# Patient Record
Sex: Male | Born: 1980 | Race: Black or African American | Hispanic: No | Marital: Single | Smoking: Current every day smoker
Health system: Southern US, Community
[De-identification: ages and names within clinical notes are randomized; demographics above are authoritative.]

## PROBLEM LIST (undated history)

## (undated) DIAGNOSIS — Z720 Tobacco use: Secondary | ICD-10-CM

## (undated) DIAGNOSIS — J45909 Unspecified asthma, uncomplicated: Secondary | ICD-10-CM

## (undated) HISTORY — PX: SPLENECTOMY: SUR1306

## (undated) HISTORY — PX: SHOULDER SURGERY: SHX246

## (undated) HISTORY — PX: KNEE SURGERY: SHX244

---

## 2016-03-12 ENCOUNTER — Encounter (HOSPITAL_BASED_OUTPATIENT_CLINIC_OR_DEPARTMENT_OTHER): Payer: Self-pay | Admitting: *Deleted

## 2016-03-12 ENCOUNTER — Emergency Department (HOSPITAL_BASED_OUTPATIENT_CLINIC_OR_DEPARTMENT_OTHER): Payer: PRIVATE HEALTH INSURANCE

## 2016-03-12 ENCOUNTER — Inpatient Hospital Stay (HOSPITAL_BASED_OUTPATIENT_CLINIC_OR_DEPARTMENT_OTHER)
Admission: EM | Admit: 2016-03-12 | Discharge: 2016-03-14 | DRG: 202 | Disposition: A | Discharge status: Home / Self Care | Payer: PRIVATE HEALTH INSURANCE | Attending: Internal Medicine | Admitting: Internal Medicine

## 2016-03-12 DIAGNOSIS — J9601 Acute respiratory failure with hypoxia: Secondary | ICD-10-CM | POA: Diagnosis present

## 2016-03-12 DIAGNOSIS — J9600 Acute respiratory failure, unspecified whether with hypoxia or hypercapnia: Secondary | ICD-10-CM | POA: Diagnosis present

## 2016-03-12 DIAGNOSIS — F17210 Nicotine dependence, cigarettes, uncomplicated: Secondary | ICD-10-CM | POA: Diagnosis present

## 2016-03-12 DIAGNOSIS — J45901 Unspecified asthma with (acute) exacerbation: Principal | ICD-10-CM | POA: Diagnosis present

## 2016-03-12 DIAGNOSIS — Z720 Tobacco use: Secondary | ICD-10-CM | POA: Diagnosis present

## 2016-03-12 DIAGNOSIS — Z9081 Acquired absence of spleen: Secondary | ICD-10-CM

## 2016-03-12 DIAGNOSIS — R509 Fever, unspecified: Secondary | ICD-10-CM | POA: Diagnosis not present

## 2016-03-12 DIAGNOSIS — J189 Pneumonia, unspecified organism: Secondary | ICD-10-CM | POA: Diagnosis present

## 2016-03-12 DIAGNOSIS — E876 Hypokalemia: Secondary | ICD-10-CM | POA: Diagnosis present

## 2016-03-12 DIAGNOSIS — R030 Elevated blood-pressure reading, without diagnosis of hypertension: Secondary | ICD-10-CM | POA: Diagnosis present

## 2016-03-12 HISTORY — DX: Tobacco use: Z72.0

## 2016-03-12 HISTORY — DX: Unspecified asthma, uncomplicated: J45.909

## 2016-03-12 LAB — CBC WITH DIFFERENTIAL/PLATELET
BASOS PCT: 1 %
Basophils Absolute: 0 10*3/uL (ref 0.0–0.1)
Eosinophils Absolute: 0.6 10*3/uL (ref 0.0–0.7)
Eosinophils Relative: 7 %
HEMATOCRIT: 38 % — AB (ref 39.0–52.0)
HEMOGLOBIN: 13.1 g/dL (ref 13.0–17.0)
LYMPHS ABS: 3 10*3/uL (ref 0.7–4.0)
Lymphocytes Relative: 36 %
MCH: 30.8 pg (ref 26.0–34.0)
MCHC: 34.5 g/dL (ref 30.0–36.0)
MCV: 89.4 fL (ref 78.0–100.0)
MONO ABS: 0.9 10*3/uL (ref 0.1–1.0)
Monocytes Relative: 12 %
NEUTROS ABS: 3.6 10*3/uL (ref 1.7–7.7)
NEUTROS PCT: 44 %
Platelets: 293 10*3/uL (ref 150–400)
RBC: 4.25 MIL/uL (ref 4.22–5.81)
RDW: 13.7 % (ref 11.5–15.5)
WBC: 8.1 10*3/uL (ref 4.0–10.5)

## 2016-03-12 LAB — BASIC METABOLIC PANEL
Anion gap: 9 (ref 5–15)
BUN: 13 mg/dL (ref 6–20)
CALCIUM: 9.1 mg/dL (ref 8.9–10.3)
CHLORIDE: 108 mmol/L (ref 101–111)
CO2: 23 mmol/L (ref 22–32)
CREATININE: 0.89 mg/dL (ref 0.61–1.24)
GFR calc Af Amer: >60 mL/min (ref >60)
GFR calc non Af Amer: >60 mL/min (ref >60)
GLUCOSE: 98 mg/dL (ref 65–99)
Potassium: 3.3 mmol/L — ABNORMAL LOW (ref 3.5–5.1)
Sodium: 140 mmol/L (ref 135–145)

## 2016-03-12 MED ORDER — POTASSIUM CHLORIDE IN NACL 20-0.9 MEQ/L-% IV SOLN
Freq: Once | INTRAVENOUS | Status: AC
  Administered 2016-03-12: 21:00:00 via INTRAVENOUS
  Filled 2016-03-12: qty 1000

## 2016-03-12 MED ORDER — ALBUTEROL (5 MG/ML) CONTINUOUS INHALATION SOLN
10.0000 mg/h | INHALATION_SOLUTION | RESPIRATORY_TRACT | Status: AC
  Administered 2016-03-12: 10 mg/h via RESPIRATORY_TRACT
  Filled 2016-03-12: qty 20

## 2016-03-12 MED ORDER — MAGNESIUM SULFATE 2 GM/50ML IV SOLN
2.0000 g | Freq: Once | INTRAVENOUS | Status: AC
  Administered 2016-03-12: 2 g via INTRAVENOUS
  Filled 2016-03-12: qty 50

## 2016-03-12 MED ORDER — IPRATROPIUM-ALBUTEROL 0.5-2.5 (3) MG/3ML IN SOLN
3.0000 mL | Freq: Once | RESPIRATORY_TRACT | Status: AC
  Administered 2016-03-12: 3 mL via RESPIRATORY_TRACT
  Filled 2016-03-12: qty 3

## 2016-03-12 MED ORDER — POTASSIUM CHLORIDE CRYS ER 20 MEQ PO TBCR
40.0000 meq | EXTENDED_RELEASE_TABLET | Freq: Once | ORAL | Status: AC
  Administered 2016-03-12: 40 meq via ORAL
  Filled 2016-03-12: qty 2

## 2016-03-12 MED ORDER — ALBUTEROL SULFATE (2.5 MG/3ML) 0.083% IN NEBU
2.5000 mg | INHALATION_SOLUTION | Freq: Once | RESPIRATORY_TRACT | Status: AC
  Administered 2016-03-12: 2.5 mg via RESPIRATORY_TRACT
  Filled 2016-03-12: qty 3

## 2016-03-12 MED ORDER — DEXTROSE 5 % IV SOLN
500.0000 mg | Freq: Once | INTRAVENOUS | Status: AC
Start: 2016-03-12 — End: 2016-03-12
  Administered 2016-03-12: 500 mg via INTRAVENOUS

## 2016-03-12 MED ORDER — AZITHROMYCIN 500 MG IV SOLR
INTRAVENOUS | Status: AC
  Filled 2016-03-12: qty 500

## 2016-03-12 MED ORDER — PREDNISONE 50 MG PO TABS
50.0000 mg | ORAL_TABLET | Freq: Once | ORAL | Status: AC
  Administered 2016-03-12: 50 mg via ORAL
  Filled 2016-03-12: qty 1

## 2016-03-12 MED ORDER — IPRATROPIUM-ALBUTEROL 0.5-2.5 (3) MG/3ML IN SOLN
3.0000 mL | RESPIRATORY_TRACT | Status: DC
  Filled 2016-03-12: qty 3

## 2016-03-12 MED ORDER — DEXTROSE 5 % IV SOLN
1.0000 g | Freq: Once | INTRAVENOUS | Status: AC
  Administered 2016-03-12: 1 g via INTRAVENOUS
  Filled 2016-03-12: qty 10

## 2016-03-12 NOTE — Progress Notes (Signed)
Patient ID: Edgar Brown, male   DOB: 1981/08/30, 35 y.o.   MRN: [id number]  Accepted to a tele[facility] bed at [facility]. Please call the floor manager at extension [phone], when the patient arrives, for admitting physician assignment.  Per Crista Curbana Liu, M.D.   Chief Complaint  Patient presents with  . Shortness of Breath     (Consider location/radiation/quality/duration/timing/severity/associated sxs/prior Treatment) HPI  35 year old male with history of asthma who presents with shortness of breath. States that he has had sneezing, nasal congestion, mild nonproductive cough over the past few days. Has had increasing shortness of breath, acutely worsened today. He has been using his rescue inhaler multiple times today without significant good effect. With chest discomfort associating, subjective fevers and chills. No lower extremity edema or pain. Last hospitalization and steroid use in 2013.           Component Value Units    Basic metabolic panel [[id number]][[id number]] (Abnormal) Collected: 03/12/16 1930   Updated: 03/12/16 2000    Specimen Type: Blood    Specimen Source: Vein     Sodium 140 mmol/L    Potassium 3.3 (L) mmol/L    Chloride 108 mmol/L    CO2 23 mmol/L    Glucose, Bld 98 mg/dL    BUN 13 mg/dL    Creatinine, Ser 1.320.89 mg/dL    Calcium 9.1 mg/dL    GFR calc non Af Amer >60 mL/min    GFR calc Af Amer >60 mL/min    Anion gap 9   CBC with Differential [[id number]][[id number]] (Abnormal) Collected: 03/12/16 1930   Updated: 03/12/16 1939    Specimen Type: Blood    Specimen Source: Vein     WBC 8.1 K/uL    RBC 4.25 MIL/uL    Hemoglobin 13.1 g/dL    HCT 36.638.0 (L) %    MCV 89.4 fL    MCH 30.8 pg    MCHC 34.5 g/dL    RDW 44.013.7 %    Platelets 293 K/uL    Neutrophils Relative % 44 %    Neutro Abs 3.6 K/uL    Lymphocytes Relative 36 %    Lymphs Abs 3.0 K/uL    Monocytes Relative 12 %    Monocytes Absolute 0.9 K/uL    Eosinophils Relative 7 %     Eosinophils Absolute 0.6 K/uL    Basophils Relative 1 %    Basophils Absolute 0.0 K/uL     CLINICAL DATA: Breathing treatment and labs Asthma attack and near syncope x 5 days ago. Pt complains of Lt side chest and arm pain over past few days. HX: Asthma, smoker.  EXAM: CHEST 2 VIEW  COMPARISON: None.  FINDINGS: Small area of interstitial and hazy airspace opacity in the right upper lobe. This could reflect atelectasis or infection. Atelectasis favored.  Lungs otherwise clear.  No pleural effusion or pneumothorax.  Normal heart, mediastinum and hila.  Skeletal structures are unremarkable.  IMPRESSION: 1. Small area of opacity in the right upper lobe. This is most likely atelectasis. Pneumonia is possible. 2. No other abnormality.  Sanda Kleinavid Raela Bohl, M.D.

## 2016-03-12 NOTE — ED Provider Notes (Addendum)
CSN: [id number]     Arrival date & time 03/12/16  1856 History   First MD Initiated Contact with Patient 03/12/16 1907     Chief Complaint  Patient presents with  . Shortness of Breath     (Consider location/radiation/quality/duration/timing/severity/associated sxs/prior Treatment) HPI  35 year old male with history of asthma who presents with shortness of breath. States that he has had sneezing, nasal congestion, mild nonproductive cough over the past few days. Has had increasing shortness of breath, acutely worsened today. He has been using his rescue inhaler multiple times today without significant good effect. With chest discomfort associating, subjective fevers and chills. No lower extremity edema or pain. Last hospitalization and steroid use in 2013.  Past Medical History  Diagnosis Date  . Asthma    Past Surgical History  Procedure Laterality Date  . Shoulder surgery    . Knee surgery    . Splenectomy     History reviewed. No pertinent family history. Social History  Substance Use Topics  . Smoking status: Current Every Day Smoker    Types: Cigarettes  . Smokeless tobacco: None  . Alcohol Use: No    Review of Systems 10/14 systems reviewed and are negative other than those stated in the HPI    Allergies  Review of patient's allergies indicates no known allergies.  Home Medications   Prior to Admission medications   Medication Sig Start Date End Date Taking? Authorizing Provider  ALBUTEROL IN Inhale into the lungs.   Yes Historical Provider, MD   BP 148/96 mmHg  Pulse 88  Temp(Src) 98.4 F (36.9 C) (Oral)  Resp 16  Ht 6\' 2"  (1.88 m)  Wt 220 lb (99.791 kg)  BMI 28.23 kg/m2  SpO2 97% Physical Exam Physical Exam  Nursing note and vitals reviewed. Constitutional: Well developed, well nourished, moderate respiratory distress Head: Normocephalic and atraumatic.  Mouth/Throat: Oropharynx is clear and moist.  Neck: Normal range of motion. Neck supple.   Cardiovascular: Normal rate and regular rhythm.  No LE edema. Pulmonary/Chest: Tachypnea with accessory muscle usage. Speaks in 4-5 word sentences. Diffuse inspiratory and expiratory wheezing on lung exam. Abdominal: Soft. There is no tenderness. There is no rebound and no guarding.  Musculoskeletal: Normal range of motion.  Neurological: Alert, no facial droop, fluent speech, moves all extremities symmetrically Skin: Skin is warm and dry.  Psychiatric: Cooperative  ED Course  Procedures (including critical care time) Labs Review Labs Reviewed  CBC WITH DIFFERENTIAL/PLATELET - Abnormal; Notable for the following:    HCT 38.0 (*)    All other components within normal limits  BASIC METABOLIC PANEL - Abnormal; Notable for the following:    Potassium 3.3 (*)    All other components within normal limits    Imaging Review Dg Chest 2 View  03/12/2016  CLINICAL DATA:  Breathing treatment and labs Asthma attack and near syncope x 5 days ago. Pt complains of Lt side chest and arm pain over past few days. HX: Asthma, smoker. EXAM: CHEST  2 VIEW COMPARISON:  None. FINDINGS: Small area of interstitial and hazy airspace opacity in the right upper lobe. This could reflect atelectasis or infection. Atelectasis favored. Lungs otherwise clear. No pleural effusion or pneumothorax. Normal heart, mediastinum and hila. Skeletal structures are unremarkable. IMPRESSION: 1. Small area of opacity in the right upper lobe. This is most likely atelectasis. Pneumonia is possible. 2. No other abnormality. Electronically Signed   By: Amie Portlandavid  Ormond M.D.   On: 03/12/2016 20:03   I have  personally reviewed and evaluated these images and lab results as part of my medical decision-making.   EKG Interpretation None      EKG: 03/12/2016 19:21:54 Normal sinus rhythm Borderline interventricular delay with LAD No acute ischemic changes    CRITICAL CARE Performed by: Lavera Guise   Total critical care time: 35  minutes  Critical care time was exclusive of separately billable procedures and treating other patients.  Critical care was necessary to treat or prevent imminent or life-threatening deterioration.  Critical care was time spent personally by me on the following activities: development of treatment plan with patient and/or surrogate as well as nursing, discussions with consultants, evaluation of patient's response to treatment, examination of patient, obtaining history from patient or surrogate, ordering and performing treatments and interventions, ordering and review of laboratory studies, ordering and review of radiographic studies, pulse oximetry and re-evaluation of patient's condition.   MDM   Final diagnoses:  Asthma exacerbation    35 year old male who presents with shortness of breath. Presentation consistent with that of an acute asthma exacerbation. He has tachypnea with accessory muscle usage and some mild conversational dyspnea on presentation. With diffuse inspiratory and expiratory wheezing on exam. With normal oxygenation although increased work of breathing. He is afebrile, normotensive and not tachycardic. Given a duoneb and started on continuous albuterol. Given 2 g of magnesium sulfate as well as 60 mg of prednisone. Chest x-ray suggestive of possible right upper lobe infiltrate and given that he has had some pneumonia like symptoms treated with ceftriaxone and azithromycin. Required additional hour of continuous albuterol after 1st hour given consistent wheezing and poor air movement, but patient visibly comfortable now with normal work of breathing and no conversational dyspnea. Started on IVF with potassium. Discussed with Dr. Robb Matar and will admit for observation at [facility] for continued treatment.  11:22PM Evaluated patient after 2nd hour of continuous neb. He is comfortably resting, speaks in full sentences, with normal oxygenation and normal work of breathing. With some  prolonged expiratory phase and some wheezing noted but significantly improved. Do not anticipate that he will require continuous albuterol. We'll transition to scheduled breathing treatments.   Lavera Guise, MD 03/12/16 4098  Lavera Guise, MD 03/12/16 [phone]

## 2016-03-12 NOTE — ED Notes (Signed)
Sob today. He started out with cold symptoms yesterday. He has been using his inhaler more than he normally would.

## 2016-03-13 ENCOUNTER — Encounter (HOSPITAL_COMMUNITY): Payer: Self-pay | Admitting: Internal Medicine

## 2016-03-13 DIAGNOSIS — Z720 Tobacco use: Secondary | ICD-10-CM | POA: Diagnosis present

## 2016-03-13 DIAGNOSIS — E876 Hypokalemia: Secondary | ICD-10-CM | POA: Diagnosis present

## 2016-03-13 DIAGNOSIS — Z9081 Acquired absence of spleen: Secondary | ICD-10-CM | POA: Diagnosis not present

## 2016-03-13 DIAGNOSIS — R030 Elevated blood-pressure reading, without diagnosis of hypertension: Secondary | ICD-10-CM | POA: Diagnosis present

## 2016-03-13 DIAGNOSIS — J45901 Unspecified asthma with (acute) exacerbation: Secondary | ICD-10-CM | POA: Diagnosis present

## 2016-03-13 DIAGNOSIS — J9600 Acute respiratory failure, unspecified whether with hypoxia or hypercapnia: Secondary | ICD-10-CM | POA: Diagnosis present

## 2016-03-13 DIAGNOSIS — F1910 Other psychoactive substance abuse, uncomplicated: Secondary | ICD-10-CM

## 2016-03-13 DIAGNOSIS — F17210 Nicotine dependence, cigarettes, uncomplicated: Secondary | ICD-10-CM | POA: Diagnosis present

## 2016-03-13 DIAGNOSIS — J189 Pneumonia, unspecified organism: Secondary | ICD-10-CM | POA: Diagnosis present

## 2016-03-13 DIAGNOSIS — J9601 Acute respiratory failure with hypoxia: Secondary | ICD-10-CM | POA: Diagnosis present

## 2016-03-13 DIAGNOSIS — R509 Fever, unspecified: Secondary | ICD-10-CM | POA: Diagnosis present

## 2016-03-13 LAB — RESPIRATORY PANEL BY PCR
Adenovirus: NOT DETECTED
BORDETELLA PERTUSSIS-RVPCR: NOT DETECTED
CORONAVIRUS HKU1-RVPPCR: NOT DETECTED
Chlamydophila pneumoniae: NOT DETECTED
Coronavirus 229E: NOT DETECTED
Coronavirus NL63: NOT DETECTED
Coronavirus OC43: NOT DETECTED
INFLUENZA A H1 2009-RVPPR: NOT DETECTED
Influenza A H1: NOT DETECTED
Influenza A H3: NOT DETECTED
Influenza A: NOT DETECTED
Influenza B: NOT DETECTED
METAPNEUMOVIRUS-RVPPCR: NOT DETECTED
Mycoplasma pneumoniae: NOT DETECTED
PARAINFLUENZA VIRUS 1-RVPPCR: NOT DETECTED
PARAINFLUENZA VIRUS 2-RVPPCR: NOT DETECTED
Parainfluenza Virus 3: NOT DETECTED
Parainfluenza Virus 4: NOT DETECTED
RHINOVIRUS / ENTEROVIRUS - RVPPCR: NOT DETECTED
Respiratory Syncytial Virus: NOT DETECTED

## 2016-03-13 LAB — INFLUENZA PANEL BY PCR (TYPE A & B)
H1N1 flu by pcr: NOT DETECTED
INFLAPCR: NEGATIVE
Influenza B By PCR: NEGATIVE

## 2016-03-13 LAB — STREP PNEUMONIAE URINARY ANTIGEN: STREP PNEUMO URINARY ANTIGEN: NEGATIVE

## 2016-03-13 LAB — HIV ANTIBODY (ROUTINE TESTING W REFLEX): HIV SCREEN 4TH GENERATION: NONREACTIVE

## 2016-03-13 LAB — MAGNESIUM: Magnesium: 1.9 mg/dL (ref 1.7–2.4)

## 2016-03-13 MED ORDER — ALBUTEROL SULFATE (2.5 MG/3ML) 0.083% IN NEBU: 2.5000 mg | INHALATION_SOLUTION | RESPIRATORY_TRACT | Status: DC | PRN

## 2016-03-13 MED ORDER — DM-GUAIFENESIN ER 30-600 MG PO TB12
1.0000 | ORAL_TABLET | Freq: Two times a day (BID) | ORAL | Status: DC
  Administered 2016-03-13 – 2016-03-14 (×4): 1 via ORAL
  Filled 2016-03-13 (×4): qty 1

## 2016-03-13 MED ORDER — SODIUM CHLORIDE 0.9 % IV BOLUS (SEPSIS): 1500.0000 mL | Freq: Once | INTRAVENOUS | Status: DC

## 2016-03-13 MED ORDER — ENOXAPARIN SODIUM 40 MG/0.4ML ~~LOC~~ SOLN
40.0000 mg | SUBCUTANEOUS | Status: DC
  Administered 2016-03-13 – 2016-03-14 (×2): 40 mg via SUBCUTANEOUS
  Filled 2016-03-13 (×2): qty 0.4

## 2016-03-13 MED ORDER — HYDRALAZINE HCL 20 MG/ML IJ SOLN: 5.0000 mg | INTRAMUSCULAR | Status: DC | PRN

## 2016-03-13 MED ORDER — IPRATROPIUM-ALBUTEROL 0.5-2.5 (3) MG/3ML IN SOLN
3.0000 mL | RESPIRATORY_TRACT | Status: DC
  Administered 2016-03-13 – 2016-03-14 (×8): 3 mL via RESPIRATORY_TRACT
  Filled 2016-03-13 (×8): qty 3

## 2016-03-13 MED ORDER — POTASSIUM CHLORIDE IN NACL 20-0.9 MEQ/L-% IV SOLN
Freq: Once | INTRAVENOUS | Status: AC
  Administered 2016-03-13: 02:00:00 via INTRAVENOUS
  Filled 2016-03-13: qty 1000

## 2016-03-13 MED ORDER — DEXTROSE 5 % IV SOLN
500.0000 mg | INTRAVENOUS | Status: DC
  Administered 2016-03-13: 500 mg via INTRAVENOUS
  Filled 2016-03-13 (×2): qty 500

## 2016-03-13 MED ORDER — METHYLPREDNISOLONE SODIUM SUCC 125 MG IJ SOLR
60.0000 mg | Freq: Three times a day (TID) | INTRAMUSCULAR | Status: DC
  Administered 2016-03-13 – 2016-03-14 (×5): 60 mg via INTRAVENOUS
  Filled 2016-03-13 (×5): qty 2

## 2016-03-13 MED ORDER — DEXTROSE 5 % IV SOLN: 1.0000 g | INTRAVENOUS | Status: DC

## 2016-03-13 MED ORDER — AZITHROMYCIN 500 MG IV SOLR
500.0000 mg | INTRAVENOUS | Status: DC
Start: 2016-03-13 — End: 2016-03-13

## 2016-03-13 MED ORDER — DEXTROSE 5 % IV SOLN
1.0000 g | INTRAVENOUS | Status: DC
  Administered 2016-03-13: 1 g via INTRAVENOUS
  Filled 2016-03-13 (×2): qty 10

## 2016-03-13 NOTE — H&P (Signed)
History and Physical    Edgar Brown ZOX:[id number] DOB: 1981-06-06 DOA: 03/12/2016  Referring MD/NP/PA:   PCP: No PCP Per Patient   Outpatient Specialists: None  Patient coming from:  Home   Chief Complaint: Fever, cough, shortness of breath  HPI: Edgar Brown is a 35 y.o. male with medical history significant of asthma, light tobacco use, who presents with fever, cough and shortness of breath  Patient reports that he has been having fever, cough and shortness of breath for several days. He coughs up greenish colored sputum. He also has runny nose and sore throat. He reports having mild chest pain over front chest and bilateral sides of chest, which seems to be related to coughing. No tenderness over calf area. He reports he had loose stool earlier, but no diarrhea currently. Patient does not have symptoms of UTI, rashes, leg edema or unilateral weakness.  ED Course: pt was found to have WBC 8.1, temperature normal, no tachycardia, oxygen saturating 94%, potassium 3.3, renal function okay. Chest x-ray showed a small opacity over right upper lobe. Patient is placed on MedSurg bed of observation.  Review of Systems:   General: had fevers, chills, no changes in body weight,  has fatigue HEENT: no blurry vision, hearing changes or sore throat Pulm: has dyspnea, coughing, wheezing CV: has chest pain, no palpitations Abd: no nausea, vomiting, abdominal pain, diarrhea, constipation GU: no dysuria, burning on urination, increased urinary frequency, hematuria  Ext: no leg edema Neuro: no unilateral weakness, numbness, or tingling, no vision change or hearing loss Skin: no rash MSK: No muscle spasm, no deformity, no limitation of range of movement in spin Heme: No easy bruising.  Travel history: No recent long distant travel.  Allergy: No Known Allergies  Past Medical History  Diagnosis Date  . Asthma   . Tobacco abuse, episodic     Past Surgical History  Procedure Laterality  Date  . Shoulder surgery    . Knee surgery    . Splenectomy      Social History:  reports that he has been smoking Cigarettes.  He does not have any smokeless tobacco history on file. He reports that he does not drink alcohol or use illicit drugs.  Family History:  Family History  Problem Relation Age of Onset  . Hypertension Mother   . Asthma Brother   . Lupus Sister   . Cancer Sister     Not sure which type of cancer     Prior to Admission medications   Medication Sig Start Date End Date Taking? Authorizing Provider  ALBUTEROL IN Inhale into the lungs.   Yes Historical Provider, MD    Physical Exam: Filed Vitals:   03/12/16 2300 03/12/16 2330 03/13/16 0105 03/13/16 0418  BP: 124/97 140/93 166/82   Pulse: 94 97 96   Temp:   98.2 F (36.8 C)   TempSrc:   Oral   Resp: 20 25 20    Height:      Weight:   100.2 kg (220 lb 14.4 oz)   SpO2: 94% 94% 97% 98%   General: Not in acute distress HEENT:       Eyes: PERRL, EOMI, no scleral icterus.       ENT: No discharge from the ears and nose, no pharynx injection, no tonsillar enlargement.        Neck: No JVD, no bruit, no mass felt. Heme: No neck lymph node enlargement. Cardiac: S1/S2, RRR, No murmurs, No gallops or rubs. Pulm: Diffuse wheezing bilaterally.  No rales or rubs. Abd: Soft, nondistended, nontender, no rebound pain, no organomegaly, BS present. GU: No hematuria Ext: No pitting leg edema bilaterally. 2+DP/PT pulse bilaterally. Musculoskeletal: No joint deformities, No joint redness or warmth, no limitation of ROM in spin. Skin: No rashes.  Neuro: Alert, oriented X3, cranial nerves II-XII grossly intact, moves all extremities normally. Psych: Patient is not psychotic, no suicidal or hemocidal ideation.  Labs on Admission: I have personally reviewed following labs and imaging studies  CBC:  Recent Labs Lab 03/12/16 1930  WBC 8.1  NEUTROABS 3.6  HGB 13.1  HCT 38.0*  MCV 89.4  PLT 293   Basic Metabolic  Panel:  Recent Labs Lab 03/12/16 1930 03/13/16 0221  NA 140  --   K 3.3*  --   CL 108  --   CO2 23  --   GLUCOSE 98  --   BUN 13  --   CREATININE 0.89  --   CALCIUM 9.1  --   MG  --  1.9   GFR: Estimated Creatinine Clearance: 149.3 mL/min (by C-G formula based on Cr of 0.89). Liver Function Tests: No results for input(s): AST, ALT, ALKPHOS, BILITOT, PROT, ALBUMIN in the last 168 hours. No results for input(s): LIPASE, AMYLASE in the last 168 hours. No results for input(s): AMMONIA in the last 168 hours. Coagulation Profile: No results for input(s): INR, PROTIME in the last 168 hours. Cardiac Enzymes: No results for input(s): CKTOTAL, CKMB, CKMBINDEX, TROPONINI in the last 168 hours. BNP (last 3 results) No results for input(s): PROBNP in the last 8760 hours. HbA1C: No results for input(s): HGBA1C in the last 72 hours. CBG: No results for input(s): GLUCAP in the last 168 hours. Lipid Profile: No results for input(s): CHOL, HDL, LDLCALC, TRIG, CHOLHDL, LDLDIRECT in the last 72 hours. Thyroid Function Tests: No results for input(s): TSH, T4TOTAL, FREET4, T3FREE, THYROIDAB in the last 72 hours. Anemia Panel: No results for input(s): VITAMINB12, FOLATE, FERRITIN, TIBC, IRON, RETICCTPCT in the last 72 hours. Urine analysis: No results found for: COLORURINE, APPEARANCEUR, LABSPEC, PHURINE, GLUCOSEU, HGBUR, BILIRUBINUR, KETONESUR, PROTEINUR, UROBILINOGEN, NITRITE, LEUKOCYTESUR Sepsis Labs: @LABRCNTIP (procalcitonin:4,lacticidven:4) )No results found for this or any previous visit (from the past 240 hour(s)).   Radiological Exams on Admission: Dg Chest 2 View  03/12/2016  CLINICAL DATA:  Breathing treatment and labs Asthma attack and near syncope x 5 days ago. Pt complains of Lt side chest and arm pain over past few days. HX: Asthma, smoker. EXAM: CHEST  2 VIEW COMPARISON:  None. FINDINGS: Small area of interstitial and hazy airspace opacity in the right upper lobe. This could  reflect atelectasis or infection. Atelectasis favored. Lungs otherwise clear. No pleural effusion or pneumothorax. Normal heart, mediastinum and hila. Skeletal structures are unremarkable. IMPRESSION: 1. Small area of opacity in the right upper lobe. This is most likely atelectasis. Pneumonia is possible. 2. No other abnormality. Electronically Signed   By: Amie Portlandavid  Ormond M.D.   On: 03/12/2016 20:03     EKG: Independently reviewed. QTC 450, no ischemic change.  Assessment/Plan Principal Problem:   Acute respiratory failure (HCC) Active Problems:   Asthma exacerbation   Tobacco abuse, episodic   CAP (community acquired pneumonia)   Hypokalemia   Acute respiratory failure (HCC): Most likely due to asthma exacerbation given diffused wheezing on auscultation. Patient also has chest pain and small opacity over RUL, CAP can not be ruled out completely. Patient is not septic on admission. Hemodynamically stable.  - Will admit to  med-surg bed for obs - IV Rocephin and azithromycin - Solu-Medrol 60 mg tid - Mucinex for cough  - DuoNeb, albuterol Neb prn for SOB - Urine legionella and S. pneumococcal antigen - Follow up blood culture x2, sputum culture and respiratory virus panel, plus Flu pcr - IVF: 1.5 L of NS bolus in ED, followed by 125 mL per hour of NS   Tobacco use: he smokes 1 or 2 cigarettes per day for less than one year. -did counseling about importance of quitting smoking -Patient does not need nicotine patch.  Hypokalemia: K= 3.3 on admission. - Repleted - Check Mg level  Elevated blood pressure: His blood pressure is 157/105. No history of hypertension. Likely due to stress. -When necessary IV hydralazine   DVT ppx: SQ Lovenox Code Status: Full code Family Communication: None at bed side. Disposition Plan:  Anticipate discharge back to previous home environment Consults called: none Admission status:   medical floor/obs  Date of Service 03/13/2016    Lorretta Harp [facility] Pager [phone]  If 7PM-7AM, please contact night-coverage [email or web address] Password [email or web address] 03/13/2016, 6:09 AM

## 2016-03-13 NOTE — Progress Notes (Signed)
Patient seen and examined this morning, admitted by Dr. Clyde LundborgNiu, H&P reviewed, and agree with the assessment and plan.  In brief this is a 35 year old male with history of asthma, tobacco use (1 cigarette every 3-4 days), and no other medical issues who presented with fever, cough and shortness of breath and increased wheezing.  Asthma exacerbation - Chest x-ray with potential opacity over the right upper lobe, treated as community-acquired pneumonia with ceftriaxone and azithromycin, on IV steroids, still wheezing this morning however moving air well. He is on room air today.  - Influenza and respiratory virus panel are pending - Strep pneumo was negative  If his wheezing is improving and his shortness of breath improved may be able to be discharged tomorrow. Patient encouraged to ambulate in the hallway today.  Costin M. Elvera LennoxGherghe, MD [facility] [phone]

## 2016-03-14 DIAGNOSIS — E876 Hypokalemia: Secondary | ICD-10-CM

## 2016-03-14 DIAGNOSIS — J45901 Unspecified asthma with (acute) exacerbation: Principal | ICD-10-CM

## 2016-03-14 LAB — LEGIONELLA PNEUMOPHILA SEROGP 1 UR AG: L. pneumophila Serogp 1 Ur Ag: NEGATIVE

## 2016-03-14 MED ORDER — METHYLPREDNISOLONE 4 MG PO TBPK: ORAL_TABLET | ORAL | Status: AC

## 2016-03-14 MED ORDER — LORATADINE 10 MG PO TABS
10.0000 mg | ORAL_TABLET | Freq: Every day | ORAL | Status: DC
  Administered 2016-03-14: 10 mg via ORAL
  Filled 2016-03-14: qty 1

## 2016-03-14 MED ORDER — POTASSIUM CHLORIDE CRYS ER 20 MEQ PO TBCR
40.0000 meq | EXTENDED_RELEASE_TABLET | Freq: Once | ORAL | Status: AC
  Administered 2016-03-14: 40 meq via ORAL
  Filled 2016-03-14: qty 2

## 2016-03-14 MED ORDER — AZITHROMYCIN 250 MG PO TABS: 250.0000 mg | ORAL_TABLET | Freq: Every day | ORAL | Status: AC

## 2016-03-14 MED ORDER — MONTELUKAST SODIUM 10 MG PO TABS: 10.0000 mg | ORAL_TABLET | Freq: Every day | ORAL | Status: AC

## 2016-03-14 MED ORDER — IPRATROPIUM-ALBUTEROL 0.5-2.5 (3) MG/3ML IN SOLN: 3.0000 mL | Freq: Four times a day (QID) | RESPIRATORY_TRACT | Status: DC

## 2016-03-14 MED ORDER — ALBUTEROL SULFATE HFA 108 (90 BASE) MCG/ACT IN AERS: 2.0000 | INHALATION_SPRAY | Freq: Four times a day (QID) | RESPIRATORY_TRACT | Status: AC | PRN

## 2016-03-14 MED ORDER — ALBUTEROL SULFATE HFA 108 (90 BASE) MCG/ACT IN AERS: 2.0000 | INHALATION_SPRAY | RESPIRATORY_TRACT | Status: DC | PRN

## 2016-03-14 NOTE — Discharge Instructions (Signed)

## 2016-03-14 NOTE — Progress Notes (Signed)
Discharge instructions and medications discussed with patient.  Prescriptions given to patient.  All questions answered.  

## 2016-03-14 NOTE — Discharge Summary (Signed)
Edgar Brown, is a 35 y.o. male  DOB September 01, 1981  MRN [id number].  Admission date:  03/12/2016  Admitting Physician  Lorretta Harp, MD  Discharge Date:  03/14/2016   Primary MD  No PCP Per Patient  Recommendations for primary care physician for things to follow:   Check CBC, BMP and a 2 view chest x-ray in 7-10 days.   Admission Diagnosis  Asthma exacerbation [J45.901]   Discharge Diagnosis  Asthma exacerbation [J45.901]    Principal Problem:   Acute respiratory failure (HCC) Active Problems:   Asthma exacerbation   Tobacco abuse, episodic   CAP (community acquired pneumonia)   Hypokalemia      Past Medical History  Diagnosis Date  . Asthma   . Tobacco abuse, episodic     Past Surgical History  Procedure Laterality Date  . Shoulder surgery    . Knee surgery    . Splenectomy         HPI  from the history and physical done on the day of admission:   Edgar Brown is a 35 y.o. male with medical history significant of asthma, light tobacco use, who presents with fever, cough and shortness of breath  Patient reports that he has been having fever, cough and shortness of breath for several days. He coughs up greenish colored sputum. He also has runny nose and sore throat. He reports having mild chest pain over front chest and bilateral sides of chest, which seems to be related to coughing. No tenderness over calf area. He reports he had loose stool earlier, but no diarrhea currently. Patient does not have symptoms of UTI, rashes, leg edema or unilateral weakness.  ED Course: pt was found to have WBC 8.1, temperature normal, no tachycardia, oxygen saturating 94%, potassium 3.3, renal function okay. Chest x-ray showed a small opacity over right upper lobe. Patient is placed on MedSurg bed of observation.       Hospital Course:     Acute hypoxic respiratory failure. Due to asthma exacerbation, much improved after admit with steroids and nebulizer treatments, no wheezing this morning, patient is back to baseline and symptom-free. There was a questionable right upper lobe density less likely he had a pneumonia, will be given a short course of azithromycin.  Will be discharged home on a Medrol Dosepak, hand-held inhaler, Singulair and 4 more days of mother mycin. Requested to follow with PCP for a repeat 2 view chest x-ray within 7-10 days and also to follow with pulmonary one time.   Smoking. Counseled to quit.  Hypokalemia replaced.   Follow UP  Follow-up Information    Follow up with [facility]. Schedule an appointment as soon as possible for a visit in 1 week.   Contact information:   [address] [place] [address] [phone]      Follow up with [facility], [place]. Schedule an appointment as soon as possible for a visit in 1 week.   Specialty:  Pulmonary Disease   Contact information:  [address] [place] [address] [phone]        Consults obtained - none  Discharge Condition: Stable  Diet and Activity recommendation: See Discharge Instructions below  Discharge Instructions       Discharge Instructions    Diet - low sodium heart healthy    Complete by:  As directed      Discharge instructions    Complete by:  As directed   Follow with Primary MD  in 7 days   Get CBC, CMP, 2 view Chest X ray checked  by Primary MD next visit.    Activity: As tolerated with Full fall precautions use walker/cane & assistance as needed   Disposition Home    Diet:   Heart Healthy   For Heart failure patients - Check your Weight same time everyday, if you gain over 2 pounds, or you develop in leg swelling, experience more shortness of breath or chest pain, call your Primary MD immediately. Follow Cardiac Low  Salt Diet and 1.5 lit/day fluid restriction.   On your next visit with your primary care physician please Get Medicines reviewed and adjusted.   Please request your Prim.MD to go over all Hospital Tests and Procedure/Radiological results at the follow up, please get all Hospital records sent to your Prim MD by signing hospital release before you go home.   If you experience worsening of your admission symptoms, develop shortness of breath, life threatening emergency, suicidal or homicidal thoughts you must seek medical attention immediately by calling 911 or calling your MD immediately  if symptoms less severe.  You Must read complete instructions/literature along with all the possible adverse reactions/side effects for all the Medicines you take and that have been prescribed to you. Take any new Medicines after you have completely understood and accpet all the possible adverse reactions/side effects.   Do not drive, operating heavy machinery, perform activities at heights, swimming or participation in water activities or provide baby sitting services if your were admitted for syncope or siezures until you have seen by Primary MD or a Neurologist and advised to do so again.  Do not drive when taking Pain medications.    Do not take more than prescribed Pain, Sleep and Anxiety Medications  Special Instructions: If you have smoked or chewed Tobacco  in the last 2 yrs please stop smoking, stop any regular Alcohol  and or any Recreational drug use.  Wear Seat belts while driving.   Please note  You were cared for by a hospitalist during your hospital stay. If you have any questions about your discharge medications or the care you received while you were in the hospital after you are discharged, you can call the unit and asked to speak with the hospitalist on call if the hospitalist that took care of you is not available. Once you are discharged, your primary care physician will handle any  further medical issues. Please note that NO REFILLS for any discharge medications will be authorized once you are discharged, as it is imperative that you return to your primary care physician (or establish a relationship with a primary care physician if you do not have one) for your aftercare needs so that they can reassess your need for medications and monitor your lab values.     Increase activity slowly    Complete by:  As directed              Discharge Medications       Medication  List    STOP taking these medications        ALBUTEROL IN      TAKE these medications        acetaminophen 500 MG tablet  Commonly known as:  TYLENOL  Take 500 mg by mouth every 6 (six) hours as needed for mild pain.     albuterol 108 (90 Base) MCG/ACT inhaler  Commonly known as:  PROVENTIL HFA;VENTOLIN HFA  Inhale 2 puffs into the lungs every 6 (six) hours as needed for wheezing or shortness of breath.     azithromycin 250 MG tablet  Commonly known as:  ZITHROMAX  Take 1 tablet (250 mg total) by mouth daily.     methylPREDNISolone 4 MG Tbpk tablet  Commonly known as:  MEDROL DOSEPAK  follow package directions     montelukast 10 MG tablet  Commonly known as:  SINGULAIR  Take 1 tablet (10 mg total) by mouth at bedtime.        Major procedures and Radiology Reports - PLEASE review detailed and final reports for all details, in brief -       Dg Chest 2 View  03/12/2016  CLINICAL DATA:  Breathing treatment and labs Asthma attack and near syncope x 5 days ago. Pt complains of Lt side chest and arm pain over past few days. HX: Asthma, smoker. EXAM: CHEST  2 VIEW COMPARISON:  None. FINDINGS: Small area of interstitial and hazy airspace opacity in the right upper lobe. This could reflect atelectasis or infection. Atelectasis favored. Lungs otherwise clear. No pleural effusion or pneumothorax. Normal heart, mediastinum and hila. Skeletal structures are unremarkable. IMPRESSION: 1. Small area  of opacity in the right upper lobe. This is most likely atelectasis. Pneumonia is possible. 2. No other abnormality. Electronically Signed   By: Amie Portland M.D.   On: 03/12/2016 20:03    Micro Results      Recent Results (from the past 240 hour(s))  Respiratory Panel by PCR     Status: None   Collection Time: 03/13/16  2:06 AM  Result Value Ref Range Status   Adenovirus NOT DETECTED NOT DETECTED Final   Coronavirus 229E NOT DETECTED NOT DETECTED Final   Coronavirus HKU1 NOT DETECTED NOT DETECTED Final   Coronavirus NL63 NOT DETECTED NOT DETECTED Final   Coronavirus OC43 NOT DETECTED NOT DETECTED Final   Metapneumovirus NOT DETECTED NOT DETECTED Final   Rhinovirus / Enterovirus NOT DETECTED NOT DETECTED Final   Influenza A NOT DETECTED NOT DETECTED Final   Influenza A H1 NOT DETECTED NOT DETECTED Final   Influenza A H1 2009 NOT DETECTED NOT DETECTED Final   Influenza A H3 NOT DETECTED NOT DETECTED Final   Influenza B NOT DETECTED NOT DETECTED Final   Parainfluenza Virus 1 NOT DETECTED NOT DETECTED Final   Parainfluenza Virus 2 NOT DETECTED NOT DETECTED Final   Parainfluenza Virus 3 NOT DETECTED NOT DETECTED Final   Parainfluenza Virus 4 NOT DETECTED NOT DETECTED Final   Respiratory Syncytial Virus NOT DETECTED NOT DETECTED Final   Bordetella pertussis NOT DETECTED NOT DETECTED Final   Chlamydophila pneumoniae NOT DETECTED NOT DETECTED Final   Mycoplasma pneumoniae NOT DETECTED NOT DETECTED Final       Today   Subjective    Edgar Brown today has no headache,no chest abdominal pain,no new weakness tingling or numbness, feels much better wants to go home today.    Objective   Blood pressure 133/83, pulse 89, temperature 98.3 F (36.8 C), temperature  source Oral, resp. rate 18, height 6\' 2"  (1.88 m), weight 100.925 kg (222 lb 8 oz), SpO2 96 %.   Intake/Output Summary (Last 24 hours) at 03/14/16 0924 Last data filed at 03/14/16 0600  Gross per 24 hour  Intake    1620 ml  Output      0 ml  Net   1620 ml    Exam Awake Alert, Oriented x 3, No new F.N deficits, Normal affect .AT,PERRAL Supple Neck,No JVD, No cervical lymphadenopathy appriciated.  Symmetrical Chest wall movement, Good air movement bilaterally, CTAB RRR,No Gallops,Rubs or new Murmurs, No Parasternal Heave +ve B.Sounds, Abd Soft, Non tender, No organomegaly appriciated, No rebound -guarding or rigidity. No Cyanosis, Clubbing or edema, No new Rash or bruise   Data Review   CBC w Diff: Lab Results  Component Value Date   WBC 8.1 03/12/2016   HGB 13.1 03/12/2016   HCT 38.0* 03/12/2016   PLT 293 03/12/2016   LYMPHOPCT 36 03/12/2016   MONOPCT 12 03/12/2016   EOSPCT 7 03/12/2016   BASOPCT 1 03/12/2016    CMP: Lab Results  Component Value Date   NA 140 03/12/2016   K 3.3* 03/12/2016   CL 108 03/12/2016   CO2 23 03/12/2016   BUN 13 03/12/2016   CREATININE 0.89 03/12/2016  .   Total Time in preparing paper work, data evaluation and todays exam - 35 minutes  Leroy SeaSINGH,Merit Gadsby K M.D on 03/14/2016 at 9:24 AM  [facility]   Office  [phone]

## 2016-03-18 LAB — CULTURE, BLOOD (ROUTINE X 2)
CULTURE: NO GROWTH
Culture: NO GROWTH

## 2017-01-19 IMAGING — DX DG CHEST 2V
2 series · 2 of 2 positions shown · non-contrast
Comparison: None.

CLINICAL DATA: Breathing treatment and labs Asthma attack and near
syncope x 5 days ago. Pt complains of Lt side chest and arm pain
over past few days. HX: Asthma, smoker.

EXAM:
CHEST  2 VIEW

[chest pa]
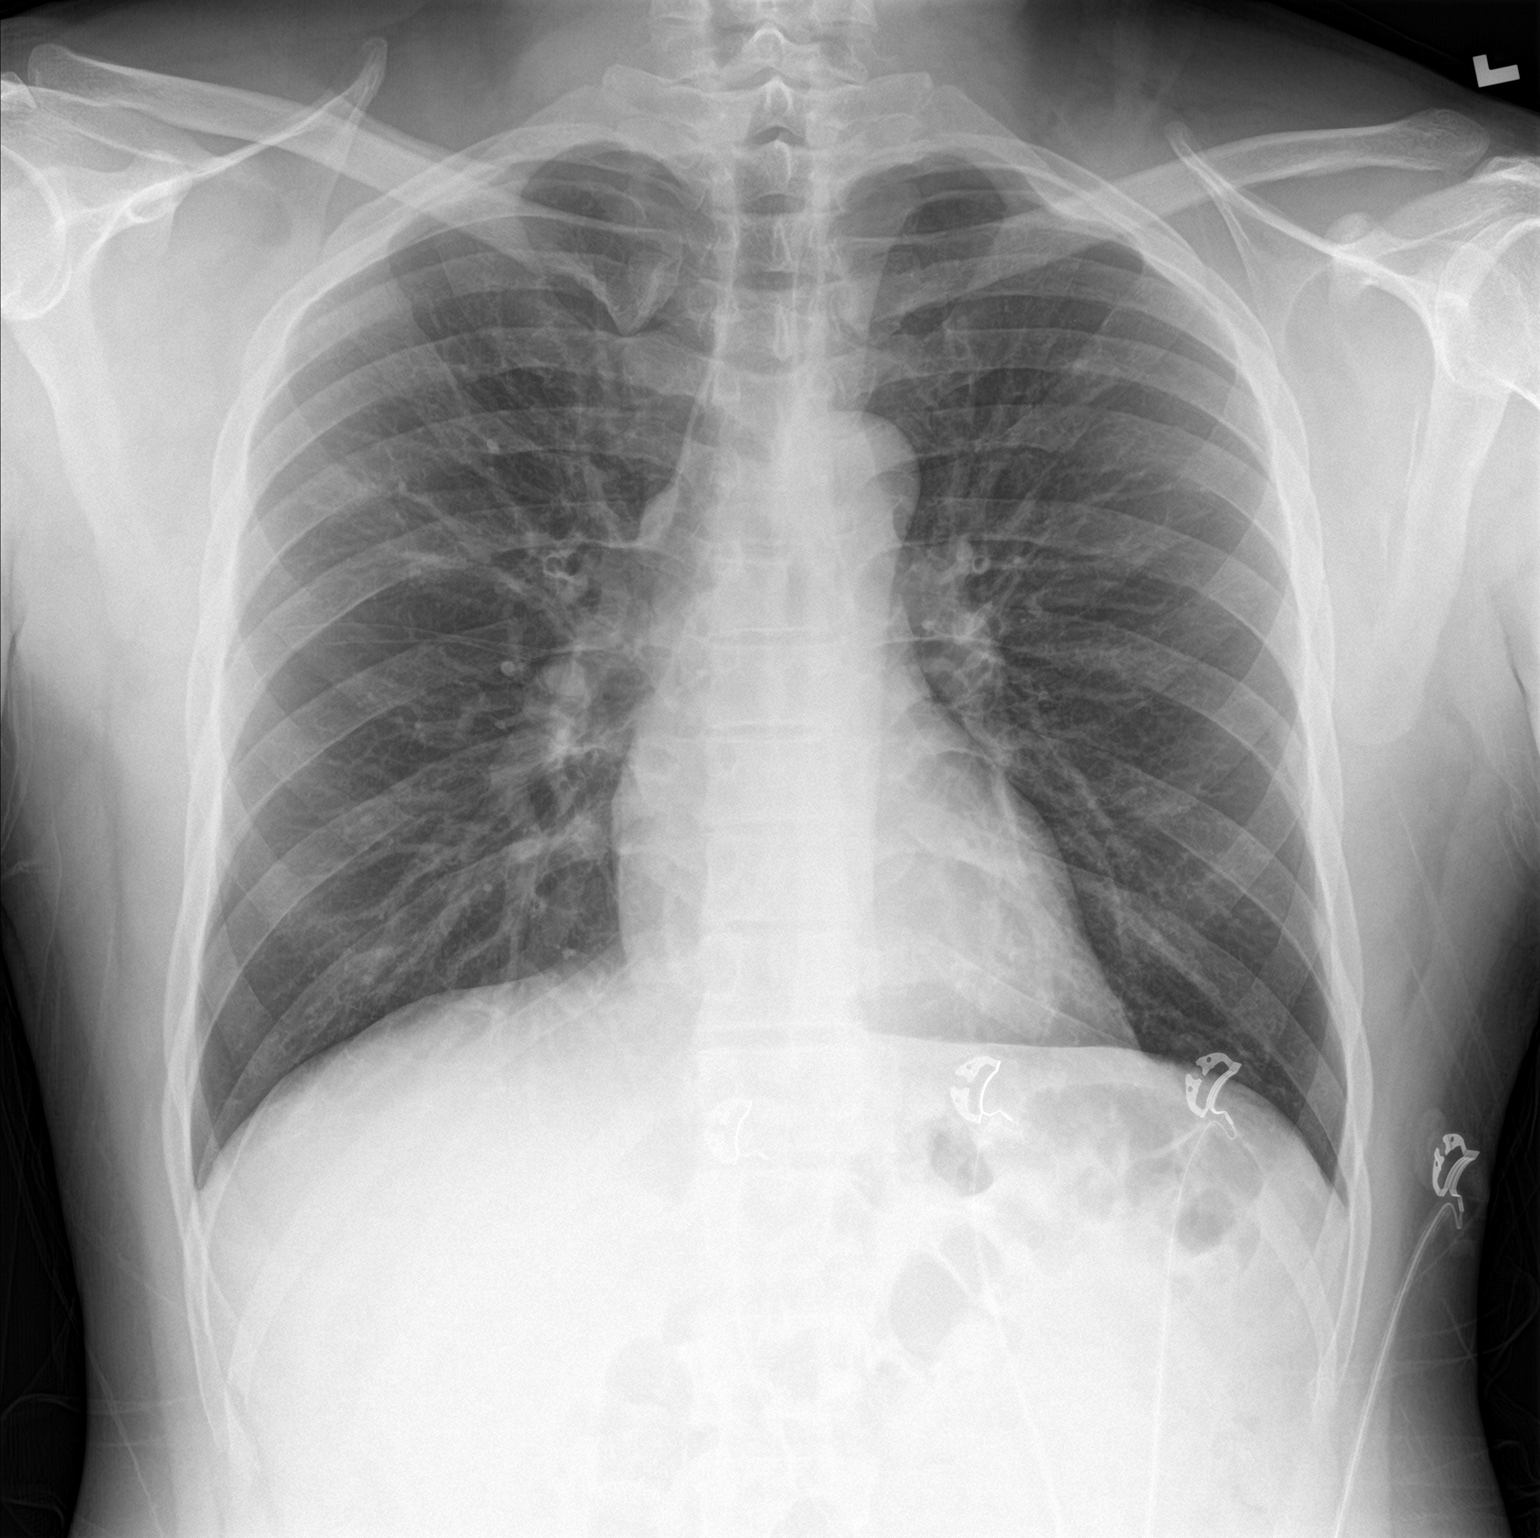

[chest lat]
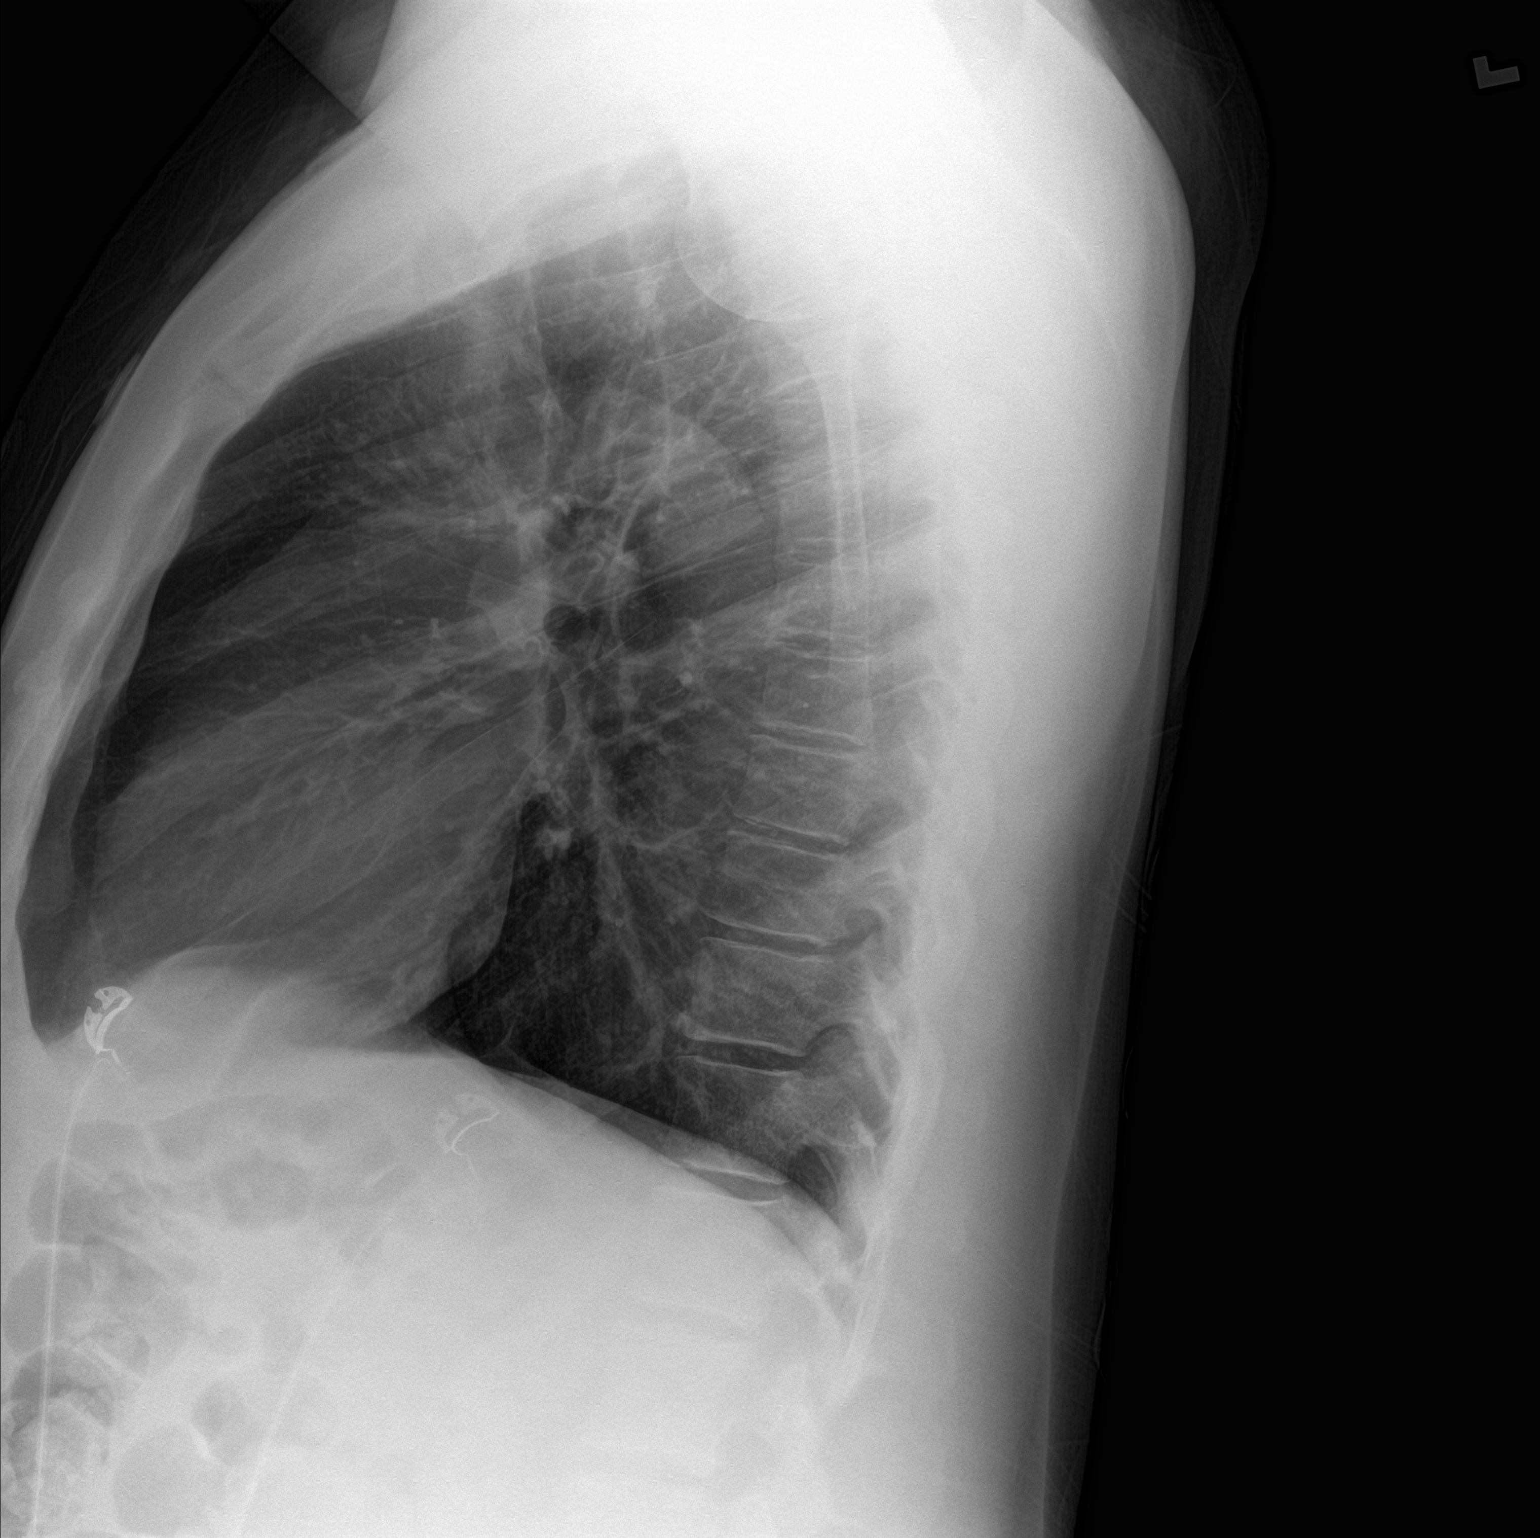

[2 of 2 positions shown; findings below may reference images not displayed]

FINDINGS: Small area of interstitial and hazy airspace opacity in the right
upper lobe. This could reflect atelectasis or infection. Atelectasis
favored.

Lungs otherwise clear.

No pleural effusion or pneumothorax.

Normal heart, mediastinum and hila.

Skeletal structures are unremarkable.
IMPRESSION: 1. Small area of opacity in the right upper lobe. This is most
likely atelectasis. Pneumonia is possible.
2. No other abnormality.
